# Patient Record
Sex: Male | Born: 1980 | Race: White | Hispanic: No | Marital: Married | Smoking: Never smoker
Health system: Southern US, Community
[De-identification: ages and names within clinical notes are randomized; demographics above are authoritative.]

## PROBLEM LIST (undated history)

## (undated) DIAGNOSIS — N200 Calculus of kidney: Secondary | ICD-10-CM

## (undated) HISTORY — PX: TONSILLECTOMY AND ADENOIDECTOMY: SUR1326

---
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
## 2006-04-12 ENCOUNTER — Emergency Department (HOSPITAL_COMMUNITY): Admission: EM | Admit: 2006-04-12 | Discharge: 2006-04-12 | Payer: Self-pay | Admitting: Emergency Medicine

## 2006-11-08 ENCOUNTER — Emergency Department (HOSPITAL_COMMUNITY): Admission: EM | Admit: 2006-11-08 | Discharge: 2006-11-08 | Payer: Self-pay | Admitting: *Deleted

## 2007-01-22 ENCOUNTER — Emergency Department (HOSPITAL_COMMUNITY): Admission: EM | Admit: 2007-01-22 | Discharge: 2007-01-22 | Payer: Self-pay | Admitting: Emergency Medicine

## 2010-11-29 ENCOUNTER — Other Ambulatory Visit: Payer: Self-pay | Admitting: Family Medicine

## 2010-11-29 DIAGNOSIS — M25572 Pain in left ankle and joints of left foot: Secondary | ICD-10-CM

## 2010-11-30 ENCOUNTER — Ambulatory Visit
Admission: RE | Admit: 2010-11-30 | Discharge: 2010-11-30 | Disposition: A | Discharge status: Home / Self Care | Payer: BC Managed Care – PPO | Source: Ambulatory Visit | Attending: Family Medicine | Admitting: Family Medicine

## 2010-11-30 DIAGNOSIS — M25572 Pain in left ankle and joints of left foot: Secondary | ICD-10-CM

## 2011-04-03 ENCOUNTER — Encounter (HOSPITAL_COMMUNITY): Payer: Self-pay

## 2011-04-03 ENCOUNTER — Emergency Department (HOSPITAL_COMMUNITY)
Admission: EM | Admit: 2011-04-03 | Discharge: 2011-04-03 | Disposition: A | Discharge status: Home / Self Care | Payer: BC Managed Care – PPO | Source: Home / Self Care | Attending: Emergency Medicine | Admitting: Emergency Medicine

## 2011-04-03 DIAGNOSIS — J029 Acute pharyngitis, unspecified: Secondary | ICD-10-CM

## 2011-04-03 LAB — POCT RAPID STREP A: Streptococcus, Group A Screen (Direct): NEGATIVE

## 2011-04-03 MED ORDER — ACETAMINOPHEN-CODEINE #3 300-30 MG PO TABS: 1.0000 | ORAL_TABLET | Freq: Four times a day (QID) | ORAL | Status: AC | PRN

## 2011-04-03 NOTE — ED Provider Notes (Signed)
History     CSN: [id number]  Arrival date & time 04/03/11  1850   First MD Initiated Contact with Patient 04/03/11 1903      Chief Complaint  Patient presents with  . Sore Throat    2 day hx of bilateral ear pain along with sore throat.  Has not taken any over the counter meds.  Did have fever last night of 103.      (Consider location/radiation/quality/duration/timing/severity/associated sxs/prior treatment) HPI Comments: Sore throat and ear pian x 2 days, also with fevers, has ad some congestion of his nose as well. Mild cough and NO SOB  Patient is a 31 y.o. male presenting with pharyngitis. The history is provided by the patient.  Sore Throat This is a new problem. The current episode started 2 days ago. The problem occurs constantly. The problem has not changed since onset.Pertinent negatives include no chest pain, no headaches and no shortness of breath. The symptoms are aggravated by swallowing.    History reviewed. No pertinent past medical history.  Past Surgical History  Procedure Date  . Tonsillectomy and adenoidectomy     History reviewed. No pertinent family history.  History  Substance Use Topics  . Smoking status: Never Smoker   . Smokeless tobacco: Never Used  . Alcohol Use: No      Review of Systems  Constitutional: Positive for fever and appetite change. Negative for fatigue.  HENT: Positive for congestion and rhinorrhea. Negative for neck pain and neck stiffness.   Respiratory: Positive for cough. Negative for shortness of breath.   Cardiovascular: Negative for chest pain.  Skin: Negative for rash.  Neurological: Negative for headaches.    Allergies  Review of patient's allergies indicates no known allergies.  Home Medications   Current Outpatient Rx  Name Route Sig Dispense Refill  . ACETAMINOPHEN-CODEINE #3 300-30 MG PO TABS Oral Take 1-2 tablets by mouth every 6 (six) hours as needed for pain. 15 tablet 0    BP 139/91  Pulse 91   Temp(Src) 98.5 F (36.9 C) (Oral)  Resp 20  SpO2 100%  Physical Exam  Nursing note and vitals reviewed. Constitutional: He appears well-developed and well-nourished.  HENT:  Head: Normocephalic.  Right Ear: Hearing, tympanic membrane, external ear and ear canal normal.  Left Ear: Hearing, tympanic membrane, external ear and ear canal normal.  Mouth/Throat: Uvula is midline and mucous membranes are normal. Posterior oropharyngeal erythema present. No oropharyngeal exudate or tonsillar abscesses.  Eyes: Conjunctivae are normal. Right eye exhibits no discharge. Left eye exhibits no discharge.  Neck: Neck supple. No JVD present.  Pulmonary/Chest: Effort normal and breath sounds normal.  Abdominal: He exhibits no distension. There is no tenderness.  Lymphadenopathy:    He has cervical adenopathy.  Skin: No rash noted. No erythema.    ED Course  Procedures (including critical care time)   Labs Reviewed  POCT RAPID STREP A ([facility] CARE ONLY)   No results found.   1. Pharyngitis       MDM  otlagia b/l with pharyngitis        Jimmie Molly, MD 04/03/11 2031

## 2011-04-03 NOTE — Discharge Instructions (Signed)
Sore Throat A sore throat is felt inside the throat and at the back of the mouth. It hurts to swallow or the throat may feel dry and scratchy. It can be caused by germs, smoking, pollution, or allergies.   HOME CARE    Only take medicine as told by your doctor.     Drink enough fluids to keep your pee (urine) clear or pale yellow.     Eat soft foods.     Do not smoke.     Rinse the mouth (gargle) with warm water or salt water ( teaspoon salt in 8 ounces of water).     Try throat sprays, lozenges, or suck on hard candy.  GET HELP RIGHT AWAY IF:    You have trouble breathing.     Your sore throat lasts longer than 1 week.     There is more puffiness (swelling) in the throat.     The pain is so bad that you are unable to swallow.     You have a very bad headache or a red rash.     You start to throw up (vomit).     You or your child has a temperature by mouth above 102 F (38.9 C), not controlled by medicine.     Your baby is older than 3 months with a rectal temperature of 102 F (38.9 C) or higher.     Your baby is 3 months old or younger with a rectal temperature of 100.4 F (38 C) or higher.  MAKE SURE YOU:    Understand these instructions.     Will watch your condition.     Will get help right away if you are not doing well or get worse.  Document Released: 11/10/2007 Document Revised: 10/13/2010 Document Reviewed: 11/10/2007 Exit[employer] Patient Information 2012 [employer]. 

## 2011-04-03 NOTE — ED Notes (Signed)
2 day hx of bilateral ear pain along with sore throat.  Has not taken any over the counter meds.  Did have fever last night of 103.

## 2012-01-05 ENCOUNTER — Ambulatory Visit (INDEPENDENT_AMBULATORY_CARE_PROVIDER_SITE_OTHER): Payer: BC Managed Care – PPO | Admitting: Family Medicine

## 2012-01-05 ENCOUNTER — Ambulatory Visit: Payer: BC Managed Care – PPO

## 2012-01-05 VITALS — BP 111/74 | HR 60 | Temp 98.2°F | Resp 16 | Ht 67.75 in | Wt 128.0 lb

## 2012-01-05 DIAGNOSIS — IMO0002 Reserved for concepts with insufficient information to code with codable children: Secondary | ICD-10-CM

## 2012-01-05 DIAGNOSIS — R079 Chest pain, unspecified: Secondary | ICD-10-CM

## 2012-01-05 DIAGNOSIS — S29011A Strain of muscle and tendon of front wall of thorax, initial encounter: Secondary | ICD-10-CM

## 2012-01-05 MED ORDER — HYDROCODONE-ACETAMINOPHEN 5-500 MG PO TABS: ORAL_TABLET | ORAL | Status: DC

## 2012-01-05 NOTE — Progress Notes (Signed)
[address]   [place], [place]  [address]   [phone]  Subjective:    Patient ID: Craig Schultz, male    DOB: [birth year], 31 y.o.   MRN: [id number]  HPIThis 31 y.o. male presents for evaluation of R sided chest pain.  Playing basketball five days ago and got tangled up with opposing player while coming down on the ball; a little sore for a few days.  Then, last night sneezed with severe stabbing R anterior chest pain. Now having sharp shooting pains in R anterior chest.  Hurts to cough, take deep breath.  Hurts moving positions.  No SOB but pain with deep breathing.  Coughing scant.  Taken Tylenol earlier in week; also took Advil last night with mild relief.  Intolerant to Tylenol.     PCP: None  Review of Systems  Constitutional: Negative for fever, chills, diaphoresis and fatigue.  Respiratory: Negative for cough, chest tightness, shortness of breath and wheezing.   Cardiovascular: Positive for chest pain. Negative for palpitations and leg swelling.  Gastrointestinal: Negative for nausea, vomiting, abdominal pain and diarrhea.    History reviewed. No pertinent past medical history.  Past Surgical History  Procedure Date  . Tonsillectomy and adenoidectomy     Prior to Admission medications   Not on File    No Known Allergies  History   Social History  . Marital Status: Married    Spouse Name: N/A    Number of Children: N/A  . Years of Education: N/A   Occupational History  . Not on file.   Social History Main Topics  . Smoking status: Never Smoker   . Smokeless tobacco: Never Used  . Alcohol Use: Yes  . Drug Use: No  . Sexually Active: Yes    Birth Control/ Protection: None   Other Topics Concern  . Not on file   Social History Narrative  . No narrative on file    Family History  Problem Relation Age of Onset  . Hypertension Mother   . Cancer Maternal Grandmother   . Anuerysm Maternal Grandfather   . Dementia Paternal Grandfather          Objective:   Physical Exam  Nursing note and vitals reviewed. Constitutional: He appears well-developed and well-nourished.  Eyes: Conjunctivae normal are normal. Pupils are equal, round, and reactive to light.  Neck: Normal range of motion. Neck supple.  Cardiovascular: Normal rate, regular rhythm and normal heart sounds.  Exam reveals no gallop and no friction rub.   No murmur heard. Pulmonary/Chest: Effort normal and breath sounds normal. No respiratory distress. He has no wheezes. He has no rales. He exhibits tenderness.       +TTP R ANTERIOR CHEST MIDDLE.    Musculoskeletal:       +TTP R ANTERIOR CHEST WALL.      [facility] reading (PRIMARY) by  Dr. Katrinka Blazing.  R RIB FILMS: NEGATIVE.     Assessment & Plan:   1. Chest pain  DG Ribs Unilateral W/Chest Right  2. Chest wall muscle strain      1.  Chest Pain R:  New. Secondary to chest wall injury.  Rx for Hydrocodone PRN; advised should not work or drive with hydrocodone. 2.  Chest wall strain/contusion R: New.  Supportive care with rest, Advil, Hydrocodone.  Passive range of motion; advised to avoid lifting for next 2-4 weeks.  RTC for development of SOB, fever.  Recommend deep breaths frequently throughout the day.   Meds  ordered this encounter  Medications  . DISCONTD: HYDROcodone-acetaminophen (VICODIN) 5-500 MG per tablet    Sig: 1-2 every six hours PRN pain    Dispense:  40 tablet    Refill:  0

## 2012-01-05 NOTE — Patient Instructions (Addendum)
1. Chest pain  DG Ribs Unilateral W/Chest Right  2. Chest wall muscle strain  HYDROcodone-acetaminophen (VICODIN) 5-500 MG per tablet     RECOMMEND ADVIL 200MG    FOUR TABLETS EVERY  MORNING AFTER BREAKFAST AND EVERY AFTERNOON AFTER LUNCH.    TAKE HYDROCODONE AT BEDTIME FOR PAIN.  RETURN FOR DEVELOPMENT OF FEVER, COUGH, SHORTNESS OF BREATH.   TAKE SEVERAL DEEP BREATHS DURING THE DAY.

## 2012-01-20 ENCOUNTER — Other Ambulatory Visit: Payer: Self-pay | Admitting: Family Medicine

## 2012-01-22 NOTE — Telephone Encounter (Signed)
Please see Dr. Michaelle Copas comment to phone in medication.

## 2012-01-24 NOTE — Telephone Encounter (Signed)
Thanks, called this.

## 2012-02-28 ENCOUNTER — Other Ambulatory Visit: Payer: Self-pay | Admitting: Family Medicine

## 2012-03-03 ENCOUNTER — Other Ambulatory Visit: Payer: Self-pay | Admitting: Family Medicine

## 2017-01-25 ENCOUNTER — Encounter (HOSPITAL_COMMUNITY): Payer: Self-pay

## 2017-01-25 DIAGNOSIS — R109 Unspecified abdominal pain: Secondary | ICD-10-CM | POA: Diagnosis present

## 2017-01-25 DIAGNOSIS — R319 Hematuria, unspecified: Secondary | ICD-10-CM | POA: Diagnosis not present

## 2017-01-25 LAB — URINALYSIS, ROUTINE W REFLEX MICROSCOPIC
Bacteria, UA: NONE SEEN
Bilirubin Urine: NEGATIVE
Glucose, UA: NEGATIVE mg/dL
Ketones, ur: 5 mg/dL — AB
LEUKOCYTES UA: NEGATIVE
Nitrite: NEGATIVE
PH: 6 (ref 5.0–8.0)
Protein, ur: 30 mg/dL — AB
SPECIFIC GRAVITY, URINE: 1.032 — AB (ref 1.005–1.030)
SQUAMOUS EPITHELIAL / LPF: NONE SEEN

## 2017-01-25 NOTE — ED Triage Notes (Signed)
Pt is alert and oriented x 4 and is friendly. Pt reports that he has left sided flank pain and has been told that he has a kidney stone and schedule for a CT scan on  01/30/17. Pt reports that he has pain 9/10 sharp and dull that radiates to his left shoulder Pt reports that pain has been constant today. Pt  states that he took Tylenol and Aleve for pain management and has not been effective, Pt has been instructed by Dr. Logan BoresEvans to come in if he has severe pain 9/10. Pt denies hematuria.

## 2017-01-26 ENCOUNTER — Emergency Department (HOSPITAL_COMMUNITY)
Admission: EM | Admit: 2017-01-26 | Discharge: 2017-01-26 | Disposition: A | Discharge status: Home / Self Care | Payer: BLUE CROSS/BLUE SHIELD | Attending: Emergency Medicine | Admitting: Emergency Medicine

## 2017-01-26 ENCOUNTER — Emergency Department (HOSPITAL_COMMUNITY): Payer: BLUE CROSS/BLUE SHIELD

## 2017-01-26 DIAGNOSIS — R109 Unspecified abdominal pain: Secondary | ICD-10-CM

## 2017-01-26 HISTORY — DX: Calculus of kidney: N20.0

## 2017-01-26 MED ORDER — OXYCODONE-ACETAMINOPHEN 5-325 MG PO TABS: 1.0000 | ORAL_TABLET | ORAL | 0 refills | Status: AC | PRN

## 2017-01-26 MED ORDER — ONDANSETRON HCL 4 MG PO TABS: 4.0000 mg | ORAL_TABLET | Freq: Four times a day (QID) | ORAL | 0 refills | Status: AC

## 2017-01-26 NOTE — Discharge Instructions (Signed)
CT scan showed no obvious kidney stone.  You do have blood in your urine.  Medication for pain and nausea.  Increase fluids.  Follow-up with Dr. Logan BoresEvans.

## 2017-01-26 NOTE — ED Provider Notes (Signed)
[facility]-EMERGENCY DEPT Provider Note   CSN: [id number] Arrival date & time: 01/25/17  2114     History   Chief Complaint Chief Complaint  Patient presents with  . Flank Pain    HPI Craig Schultz is a 36 y.o. male.  Intermittent left flank pain for the past 2 days.  Patient was diagnosed with a 3 mm left kidney stone via ultrasound on 12/14/16.  No dysuria, hematuria, fever, sweats, chills.  No radiation of pain.  Patient has been evaluated by urologist Dr. Logan BoresEvans.  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  Severity of pain is moderate.  Nothing makes symptoms better or worse.      Past Medical History:  Diagnosis Date  . Kidney stones   . Kidney stones     There are no active problems to display for this patient.   Past Surgical History:  Procedure Laterality Date  . TONSILLECTOMY AND ADENOIDECTOMY         Home Medications    Prior to Admission medications   Medication Sig Start Date End Date Taking? Authorizing Provider  HYDROcodone-acetaminophen (VICODIN) 5-500 MG per tablet TAKE 1 OR 2 TABLETS BY MOUTH EVERY 6 HOURS AS NEEDED FOR PAIN 01/20/12   Ethelda ChickSmith, Kristi M, MD  ondansetron (ZOFRAN) 4 MG tablet Take 1 tablet (4 mg total) by mouth every 6 (six) hours. 01/26/17   Donnetta Hutchingook, Nikka Hakimian, MD  oxyCODONE-acetaminophen (PERCOCET) 5-325 MG tablet Take 1 tablet by mouth every 4 (four) hours as needed. 01/26/17   Donnetta Hutchingook, Myda Detwiler, MD    Family History Family History  Problem Relation Age of Onset  . Hypertension Mother   . Cancer Maternal Grandmother   . Anuerysm Maternal Grandfather   . Dementia Paternal Grandfather     Social History Social History   Tobacco Use  . Smoking status: Never Smoker  . Smokeless tobacco: Never Used  Substance Use Topics  . Alcohol use: Yes  . Drug use: No     Allergies   Patient has no known allergies.   Review of Systems Review of Systems  All other systems reviewed and are negative.    Physical  Exam Updated Vital Signs BP 118/83 (BP Location: Left Arm)   Pulse 75   Temp 98 F (36.7 C) (Oral)   Resp 16   Ht 5\' 6"  (1.676 m)   Wt 54.4 kg (120 lb)   SpO2 100%   BMI 19.37 kg/m   Physical Exam  Constitutional: He is oriented to person, place, and time. He appears well-developed and well-nourished.  HENT:  Head: Normocephalic and atraumatic.  Eyes: Conjunctivae are normal.  Neck: Neck supple.  Cardiovascular: Normal rate and regular rhythm.  Pulmonary/Chest: Effort normal and breath sounds normal.  Abdominal: Soft. Bowel sounds are normal.  Genitourinary:  Genitourinary Comments: Minimal left flank tenderness  Musculoskeletal: Normal range of motion.  Neurological: He is alert and oriented to person, place, and time.  Skin: Skin is warm and dry.  Psychiatric: He has a normal mood and affect. His behavior is normal.  Nursing note and vitals reviewed.    ED Treatments / Results  Labs (all labs ordered are listed, but only abnormal results are displayed) Labs Reviewed  URINALYSIS, ROUTINE W REFLEX MICROSCOPIC - Abnormal; Notable for the following components:      Result Value   APPearance HAZY (*)    Specific Gravity, Urine 1.032 (*)    Hgb urine dipstick LARGE (*)    Ketones, ur  5 (*)    Protein, ur 30 (*)    All other components within normal limits    EKG  EKG Interpretation None       Radiology Ct Renal Stone Study  Result Date: 01/26/2017 CLINICAL DATA:  Acute onset of left flank pain.  Microhematuria. EXAM: CT ABDOMEN AND PELVIS WITHOUT CONTRAST TECHNIQUE: Multidetector CT imaging of the abdomen and pelvis was performed following the standard protocol without IV contrast. COMPARISON:  None. FINDINGS: Lower chest: The visualized lung bases are grossly clear. The visualized portions of the mediastinum are unremarkable. Hepatobiliary: The liver is unremarkable in appearance. The gallbladder is unremarkable in appearance. The common bile duct remains normal  in caliber. Pancreas: The pancreas is within normal limits. Spleen: The spleen is unremarkable in appearance. Adrenals/Urinary Tract: The adrenal glands are unremarkable in appearance. The kidneys are within normal limits. There is no evidence of hydronephrosis. No renal or ureteral stones are identified. No perinephric stranding is seen. Stomach/Bowel: The stomach is unremarkable in appearance. The small bowel is within normal limits. The appendix is normal in caliber, without evidence of appendicitis. The colon is unremarkable in appearance. Vascular/Lymphatic: Minimal calcification is noted at the distal abdominal aorta. The inferior vena cava is grossly unremarkable. No retroperitoneal lymphadenopathy is seen. No pelvic sidewall lymphadenopathy is identified. Reproductive: The bladder is mildly distended and grossly unremarkable. The prostate remains normal in size. Other: No additional soft tissue abnormalities are seen. Musculoskeletal: No acute osseous abnormalities are identified. The visualized musculature is unremarkable in appearance. IMPRESSION: No acute abnormality seen within the abdomen or pelvis. Electronically Signed   By: Roanna RaiderJeffery  Chang M.D.   On: 01/26/2017 06:25    Procedures Procedures (including critical care time)  Medications Ordered in ED Medications - No data to display   Initial Impression / Assessment and Plan / ED Course  I have reviewed the triage vital signs and the nursing notes.  Pertinent labs & imaging results that were available during my care of the patient were reviewed by me and considered in my medical decision making (see chart for details).     Patient is in no acute distress.  Urinalysis reveals hemoglobin and RBCs.  CT renal shows no obvious stone.  Test results were discussed with the patient.  He will follow-up with Dr. Logan BoresEvans.  Discharge medications Percocet and Zofran 4 mg.  Final Clinical Impressions(s) / ED Diagnoses   Final diagnoses:  Left  flank pain    ED Discharge Orders        Ordered    oxyCODONE-acetaminophen (PERCOCET) 5-325 MG tablet  Every 4 hours PRN     01/26/17 0813    ondansetron (ZOFRAN) 4 MG tablet  Every 6 hours     01/26/17 0813       Donnetta Hutchingook, Zykera Abella, MD 01/26/17 [phone]

## 2017-01-26 NOTE — ED Notes (Signed)
Patient upset in lobby and registration came to get nurse Hydrographic surveyor(writer). I spoke with patient who is upset that he has been waiting for over 5 hours. Patient explained he has been updated however, he feels we are just letting him sit in the lobby and wait. Explained to the patient that at this time there is not a bed available to bring him back and that his vitals were stable. Assured him that he would be brought back as soon as possible and that all the staff and providers are working hard to see patients in a safe and effective manner. Patient still remains upset stating "I have to be to work at 8, I am here because my urologist told me to come due to my kidney stones. You all don't care about your patients and the other providers since they are not [facility] by [facility]. I need to walk to the back and see the beds and make sure all the hall beds and rooms are being used. I called and they told me it was a 6hr wait and I have been here that long. I want my urine back so I can leave." Advised patient we could not return his urine and that we also could not allow him to just walk to the back of the treatment area to see if beds were actually available. Again reassured him that we were working diligently to get him seen as soon as possible. Patient has been spoken to previously and given Kennyth ArnoldStacy, Directors card and patient experience. He reports he has left a message with her already and will be leaving another one about how unprofessional our organization is and allowing him to wait over 6hr. Charge, RN Candise Bowens(Jen) is aware of the situation as well. Unable to offer patient snacks for service recovery due to his diagnosis.

## 2018-01-27 IMAGING — CT CT RENAL STONE PROTOCOL
2 of 7 series · 14 of 46 positions shown, 18 images · non-contrast
Comparison: None.

CLINICAL DATA: Acute onset of left flank pain.  Microhematuria.

EXAM:
CT ABDOMEN AND PELVIS WITHOUT CONTRAST
TECHNIQUE: Multidetector CT imaging of the abdomen and pelvis was performed
following the standard protocol without IV contrast.

[Series 6: axial st · axial · 0.67mm/px · z∈[+1250,+1535]mm · 11 of 68 slices shown, 15 images]
[im 7/68  soft-tissue]
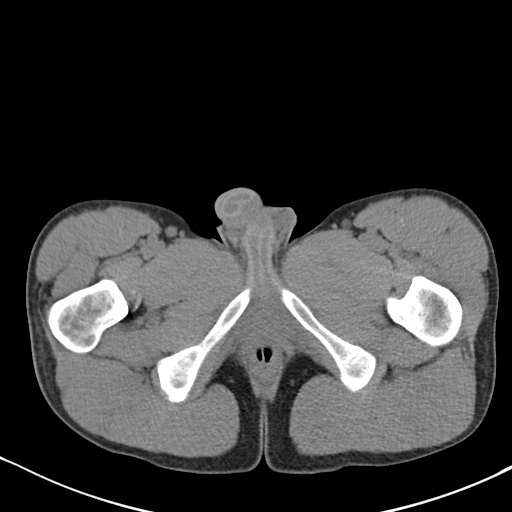
[im 7/68  bone]
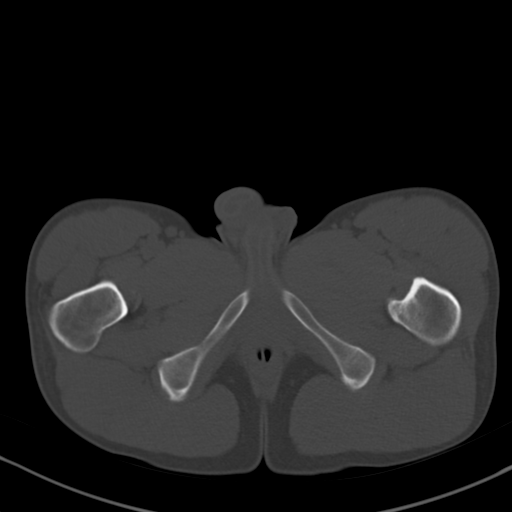
[im 13/68  soft-tissue]
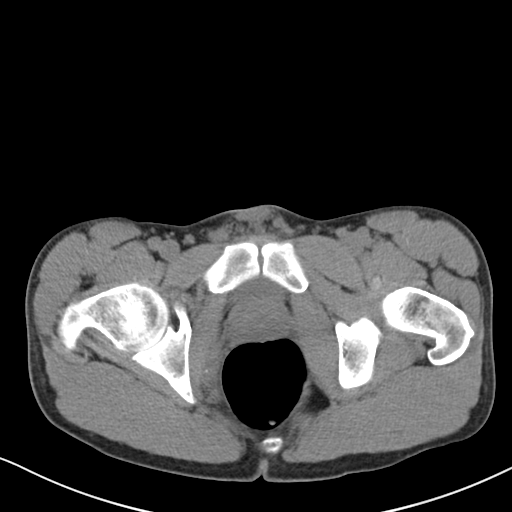
[im 19/68  soft-tissue]
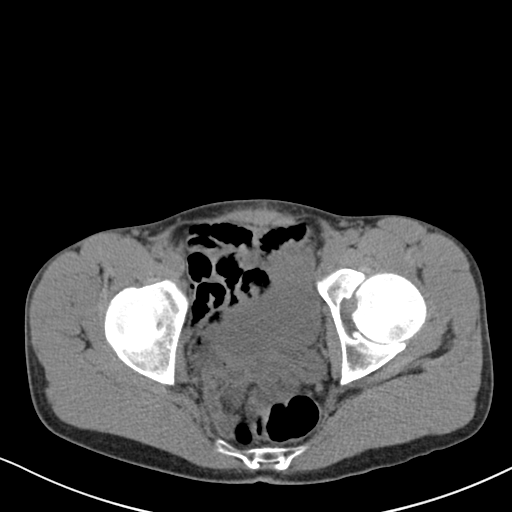
[im 28/68  soft-tissue]
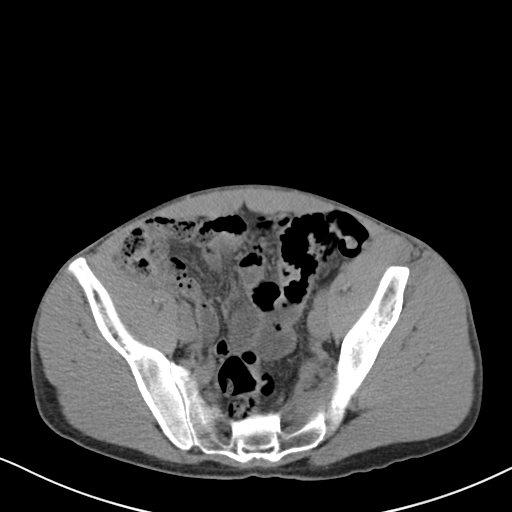
[im 34/68  soft-tissue]
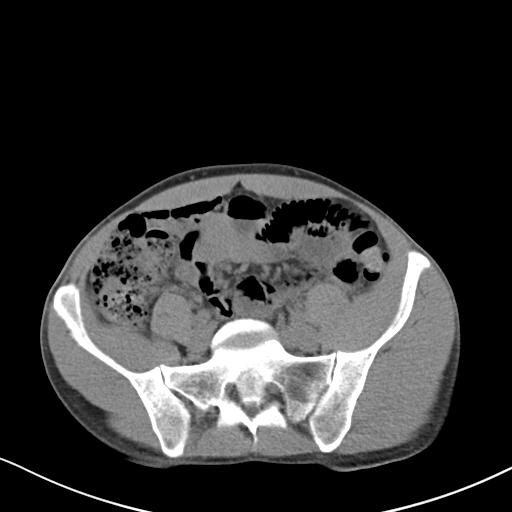
[im 40/68  soft-tissue]
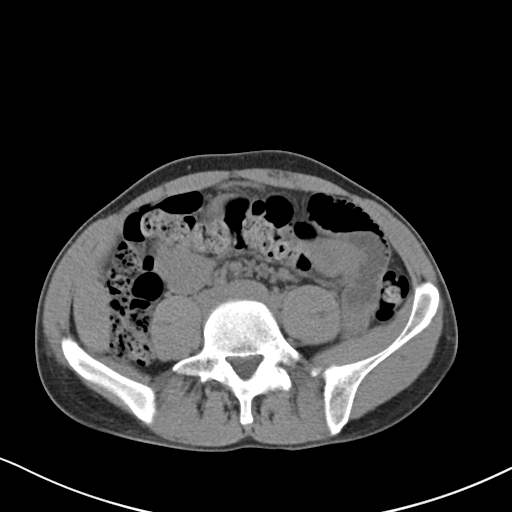
[im 49/68  soft-tissue]
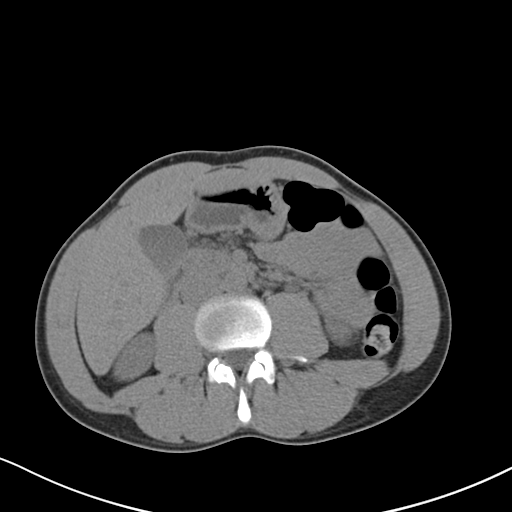
[im 55/68  soft-tissue]
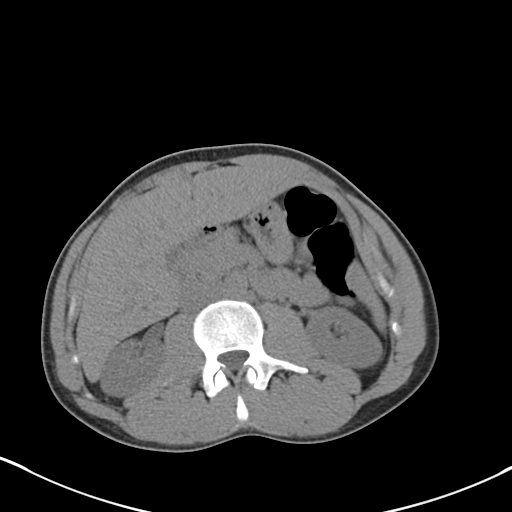
[im 55/68  lung]
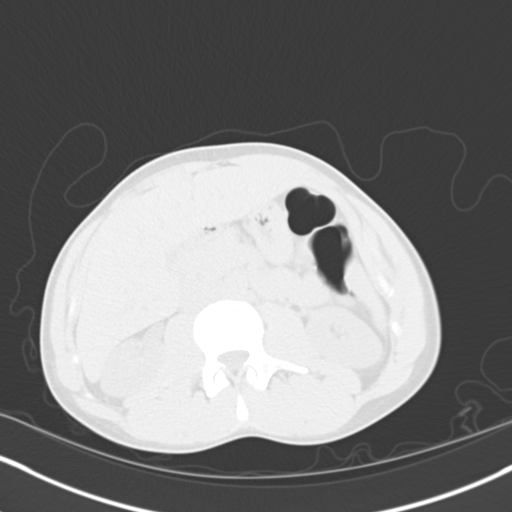
[im 58/68  lung]
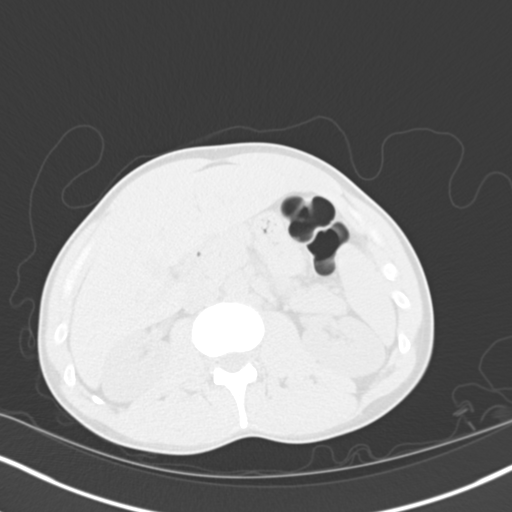
[im 61/68  soft-tissue]
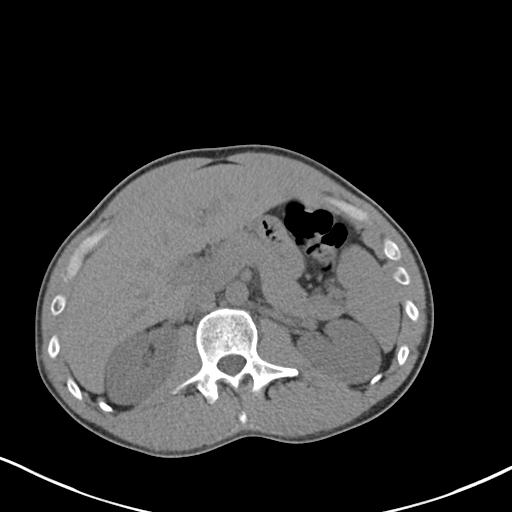
[im 61/68  lung]
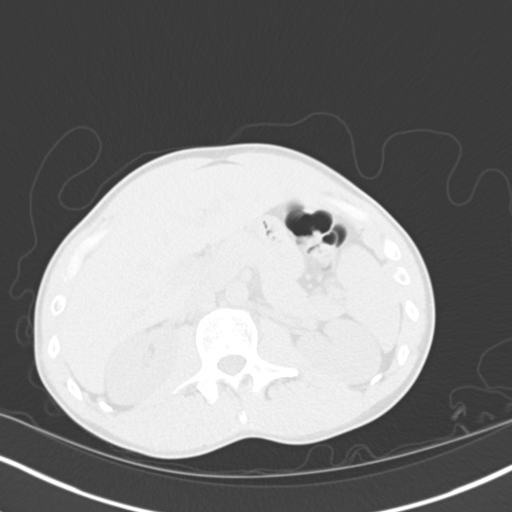
[im 61/68  bone]
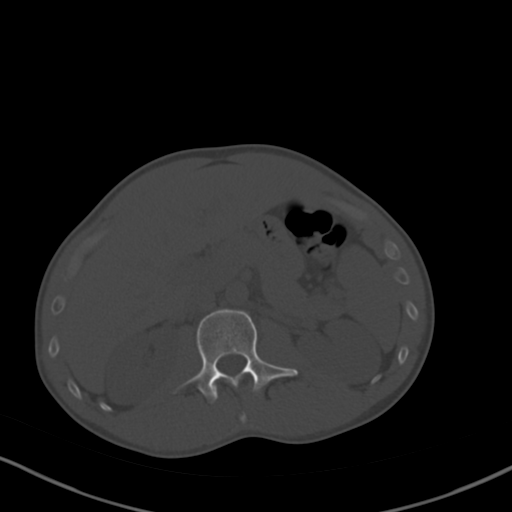
[im 64/68  lung]
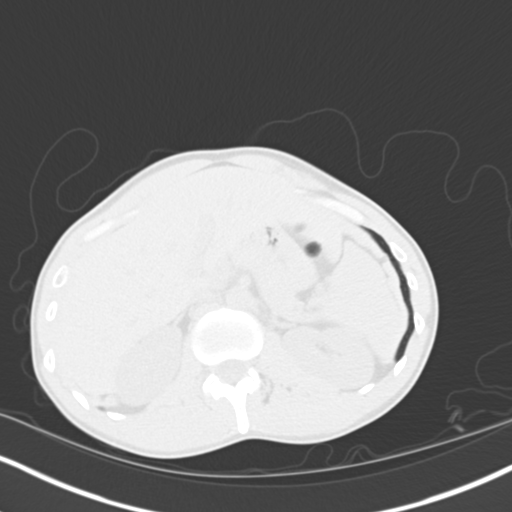

[Series 8: coronal · coronal · 0.51mm/px · 3 of 109 slices shown]
[im 28/109  soft-tissue]
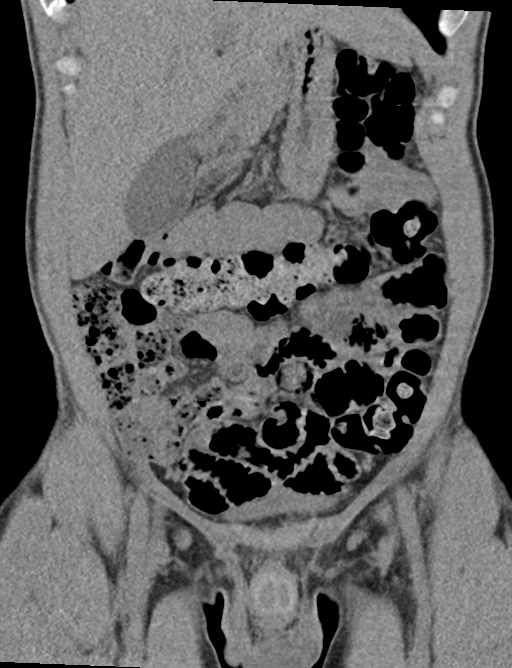
[im 55/109  soft-tissue]
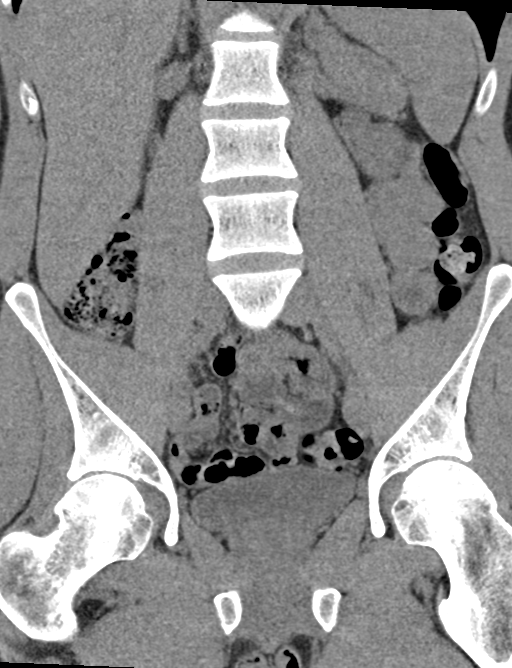
[im 82/109  soft-tissue]
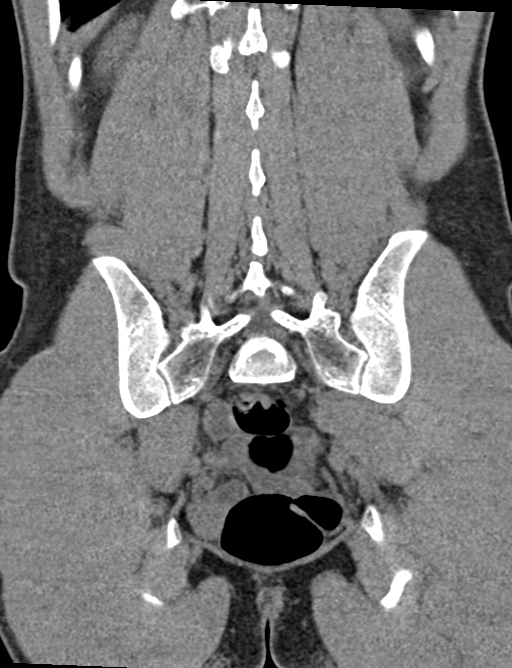

[14 of 46 positions shown; findings below may reference images not displayed]

FINDINGS: Lower chest: The visualized lung bases are grossly clear. The
visualized portions of the mediastinum are unremarkable.

Hepatobiliary: The liver is unremarkable in appearance. The
gallbladder is unremarkable in appearance. The common bile duct
remains normal in caliber.

Pancreas: The pancreas is within normal limits.

Spleen: The spleen is unremarkable in appearance.

Adrenals/Urinary Tract: The adrenal glands are unremarkable in
appearance. The kidneys are within normal limits. There is no
evidence of hydronephrosis. No renal or ureteral stones are
identified. No perinephric stranding is seen.

Stomach/Bowel: The stomach is unremarkable in appearance. The small
bowel is within normal limits. The appendix is normal in caliber,
without evidence of appendicitis. The colon is unremarkable in
appearance.

Vascular/Lymphatic: Minimal calcification is noted at the distal
abdominal aorta. The inferior vena cava is grossly unremarkable. No
retroperitoneal lymphadenopathy is seen. No pelvic sidewall
lymphadenopathy is identified.

Reproductive: The bladder is mildly distended and grossly
unremarkable. The prostate remains normal in size.

Other: No additional soft tissue abnormalities are seen.

Musculoskeletal: No acute osseous abnormalities are identified. The
visualized musculature is unremarkable in appearance.
IMPRESSION: No acute abnormality seen within the abdomen or pelvis.

## 2019-01-20 ENCOUNTER — Encounter (INDEPENDENT_AMBULATORY_CARE_PROVIDER_SITE_OTHER): Payer: Self-pay

## 2019-01-20 ENCOUNTER — Telehealth: Payer: BLUE CROSS/BLUE SHIELD | Admitting: Family

## 2019-01-20 DIAGNOSIS — Z20828 Contact with and (suspected) exposure to other viral communicable diseases: Secondary | ICD-10-CM

## 2019-01-20 DIAGNOSIS — Z20822 Contact with and (suspected) exposure to covid-19: Secondary | ICD-10-CM

## 2019-01-20 MED ORDER — BENZONATATE 100 MG PO CAPS: 100.0000 mg | ORAL_CAPSULE | Freq: Three times a day (TID) | ORAL | 0 refills | Status: AC | PRN

## 2019-01-20 NOTE — Progress Notes (Signed)
E-Visit for Corona Virus Screening   Your current symptoms could be consistent with the coronavirus.  Many health care providers can now test patients at their office but not all are.  [facility] has multiple testing sites. For information on our COVID testing locations and hours go to [email or web address]  Please quarantine yourself while awaiting your test results.  We are enrolling you in our MyChart [facility] for COVID19 . Daily you will receive a questionnaire within the MyChart website. Our COVID 19 response team willl be monitoriing your responses daily. Please continue good preventive care measures, including:  frequent hand-washing, avoid touching your face, cover coughs/sneezes, stay out of crowds and keep a 6 foot distance from others.    COVID-19 is a respiratory illness with symptoms that are similar to the flu. Symptoms are typically mild to moderate, but there have been cases of severe illness and death due to the virus. The following symptoms may appear 2-14 days after exposure: . Fever . Cough . Shortness of breath or difficulty breathing . Chills . Repeated shaking with chills . Muscle pain . Headache . Sore throat . New loss of taste or smell . Fatigue . Congestion or runny nose . Nausea or vomiting . Diarrhea  If you develop fever/cough/breathlessness, please stay home for 10 days with improving symptoms and until you have had 24 hours of no fever (without taking a fever reducer).  Go to the nearest hospital ED for assessment if fever/cough/breathlessness are severe or illness seems like a threat to life.  It is vitally important that if you feel that you have an infection such as this virus or any other virus that you stay home and away from places where you may spread it to others.  You should avoid contact with people age 65 and older.   You should wear a mask or cloth face covering over your nose and mouth if you must be around other  people or animals, including pets (even at home). Try to stay at least 6 feet away from other people. This will protect the people around you.  You can use medication such as A prescription cough medication called Tessalon Perles 100 mg. You may take 1-2 capsules every 8 hours as needed for cough  You may also take acetaminophen (Tylenol) as needed for fever.   Reduce your risk of any infection by using the same precautions used for avoiding the common cold or flu:  . Wash your hands often with soap and warm water for at least 20 seconds.  If soap and water are not readily available, use an alcohol-based hand sanitizer with at least 60% alcohol.  . If coughing or sneezing, cover your mouth and nose by coughing or sneezing into the elbow areas of your shirt or coat, into a tissue or into your sleeve (not your hands). . Avoid shaking hands with others and consider head nods or verbal greetings only. . Avoid touching your eyes, nose, or mouth with unwashed hands.  . Avoid close contact with people who are sick. . Avoid places or events with large numbers of people in one location, like concerts or sporting events. . Carefully consider travel plans you have or are making. . If you are planning any travel outside or inside the [place], visit the CDC's Travelers' Health webpage for the latest health notices. . If you have some symptoms but not all symptoms, continue to monitor at home and seek medical attention if your symptoms worsen. . If   you are having a medical emergency, call 911.  HOME CARE . Only take medications as instructed by your medical team. . Drink plenty of fluids and get plenty of rest. . A steam or ultrasonic humidifier can help if you have congestion.   GET HELP RIGHT AWAY IF YOU HAVE EMERGENCY WARNING SIGNS** FOR COVID-19. If you or someone is showing any of these signs seek emergency medical care immediately. Call 911 or proceed to your closest emergency facility if: . You develop  worsening high fever. . Trouble breathing . Bluish lips or face . Persistent pain or pressure in the chest . New confusion . Inability to wake or stay awake . You cough up blood. . Your symptoms become more severe  **This list is not all possible symptoms. Contact your medical provider for any symptoms that are sever or concerning to you.   MAKE SURE YOU   Understand these instructions.  Will watch your condition.  Will get help right away if you are not doing well or get worse.  Your e-visit answers were reviewed by a board certified advanced clinical practitioner to complete your personal care plan.  Depending on the condition, your plan could have included both over the counter or prescription medications.  If there is a problem please reply once you have received a response from your provider.  Your safety is important to [place].  If you have drug allergies check your prescription carefully.    You can use MyChart to ask questions about today's visit, request a non-urgent call back, or ask for a work or school excuse for 24 hours related to this e-Visit. If it has been greater than 24 hours you will need to follow up with your provider, or enter a new e-Visit to address those concerns. You will get an e-mail in the next two days asking about your experience.  I hope that your e-visit has been valuable and will speed your recovery. Thank you for using e-visits.   Approximately 5 minutes was spent documenting and reviewing patient's chart.

## 2019-01-22 ENCOUNTER — Encounter (INDEPENDENT_AMBULATORY_CARE_PROVIDER_SITE_OTHER): Payer: Self-pay
